# Patient Record
Sex: Male | Born: 2000 | Race: White | Hispanic: No | Marital: Single | Smoking: Never smoker
Health system: Southern US, Community
[De-identification: ages and names within clinical notes are randomized; demographics above are authoritative.]

---
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
## 2024-04-26 ENCOUNTER — Emergency Department (HOSPITAL_COMMUNITY)
Admission: EM | Admit: 2024-04-26 | Discharge: 2024-04-26 | Disposition: A | Discharge status: Home / Self Care | Attending: Emergency Medicine | Admitting: Emergency Medicine

## 2024-04-26 ENCOUNTER — Emergency Department (HOSPITAL_COMMUNITY)

## 2024-04-26 DIAGNOSIS — R079 Chest pain, unspecified: Secondary | ICD-10-CM | POA: Diagnosis not present

## 2024-04-26 DIAGNOSIS — D72829 Elevated white blood cell count, unspecified: Secondary | ICD-10-CM | POA: Diagnosis not present

## 2024-04-26 DIAGNOSIS — M79675 Pain in left toe(s): Secondary | ICD-10-CM | POA: Insufficient documentation

## 2024-04-26 DIAGNOSIS — S3991XA Unspecified injury of abdomen, initial encounter: Secondary | ICD-10-CM | POA: Diagnosis not present

## 2024-04-26 DIAGNOSIS — Y92410 Unspecified street and highway as the place of occurrence of the external cause: Secondary | ICD-10-CM | POA: Diagnosis not present

## 2024-04-26 DIAGNOSIS — S0990XA Unspecified injury of head, initial encounter: Secondary | ICD-10-CM | POA: Diagnosis present

## 2024-04-26 LAB — COMPREHENSIVE METABOLIC PANEL WITH GFR
ALT: 16 U/L (ref 0–44)
AST: 25 U/L (ref 15–41)
Albumin: 4.7 g/dL (ref 3.5–5.0)
Alkaline Phosphatase: 76 U/L (ref 38–126)
Anion gap: 11 (ref 5–15)
BUN: 9 mg/dL (ref 6–20)
CO2: 23 mmol/L (ref 22–32)
Calcium: 9.4 mg/dL (ref 8.9–10.3)
Chloride: 102 mmol/L (ref 98–111)
Creatinine, Ser: 0.83 mg/dL (ref 0.61–1.24)
GFR, Estimated: >60 mL/min (ref >60)
Glucose, Bld: 85 mg/dL (ref 70–99)
Potassium: 3.6 mmol/L (ref 3.5–5.1)
Sodium: 136 mmol/L (ref 135–145)
Total Bilirubin: 0.5 mg/dL (ref 0.0–1.2)
Total Protein: 7.1 g/dL (ref 6.5–8.1)

## 2024-04-26 LAB — URINALYSIS, ROUTINE W REFLEX MICROSCOPIC
Bacteria, UA: NONE SEEN
Bilirubin Urine: NEGATIVE
Glucose, UA: NEGATIVE mg/dL
Ketones, ur: NEGATIVE mg/dL
Nitrite: NEGATIVE
Protein, ur: NEGATIVE mg/dL
Specific Gravity, Urine: 1.016 (ref 1.005–1.030)
pH: 7 (ref 5.0–8.0)

## 2024-04-26 LAB — CBC
HCT: 40.7 % (ref 39.0–52.0)
Hemoglobin: 14.1 g/dL (ref 13.0–17.0)
MCH: 30.5 pg (ref 26.0–34.0)
MCHC: 34.6 g/dL (ref 30.0–36.0)
MCV: 88.1 fL (ref 80.0–100.0)
Platelets: 272 K/uL (ref 150–400)
RBC: 4.62 MIL/uL (ref 4.22–5.81)
RDW: 12.6 % (ref 11.5–15.5)
WBC: 15.3 K/uL — ABNORMAL HIGH (ref 4.0–10.5)
nRBC: 0 % (ref 0.0–0.2)

## 2024-04-26 MED ORDER — METHOCARBAMOL 500 MG PO TABS: 1000.0000 mg | ORAL_TABLET | Freq: Three times a day (TID) | ORAL | 0 refills | Status: AC | PRN

## 2024-04-26 MED ORDER — METHOCARBAMOL 500 MG PO TABS
1000.0000 mg | ORAL_TABLET | Freq: Once | ORAL | Status: AC
  Administered 2024-04-26: 1000 mg via ORAL
  Filled 2024-04-26: qty 2

## 2024-04-26 MED ORDER — ACETAMINOPHEN 325 MG PO TABS
650.0000 mg | ORAL_TABLET | Freq: Once | ORAL | Status: AC
  Administered 2024-04-26: 650 mg via ORAL
  Filled 2024-04-26: qty 2

## 2024-04-26 MED ORDER — IOHEXOL 300 MG/ML  SOLN
100.0000 mL | Freq: Once | INTRAMUSCULAR | Status: AC | PRN
Start: 2024-04-26 — End: 2024-04-26
  Administered 2024-04-26: 100 mL via INTRAVENOUS

## 2024-04-26 NOTE — ED Notes (Signed)
 ED Provider at bedside.

## 2024-04-26 NOTE — Discharge Instructions (Signed)
 Your test results today are reassuring.  You will have ongoing pain and soreness, which will likely be worse tomorrow.  Take ibuprofen  and Tylenol  as needed.  A prescription for a muscle relaxer was sent to your pharmacy.  Take this as needed as well.  Return to the emergency department for any new or worsening symptoms of concern.

## 2024-04-26 NOTE — ED Notes (Signed)
 Patient transported to CT
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
## 2024-04-27 LAB — I-STAT CHEM 8, ED
BUN: 9 mg/dL (ref 6–20)
Calcium, Ion: 1.19 mmol/L (ref 1.15–1.40)
Chloride: 103 mmol/L (ref 98–111)
Creatinine, Ser: 0.9 mg/dL (ref 0.61–1.24)
Glucose, Bld: 76 mg/dL (ref 70–99)
HCT: 45 % (ref 39.0–52.0)
Hemoglobin: 15.3 g/dL (ref 13.0–17.0)
Potassium: 3.4 mmol/L — ABNORMAL LOW (ref 3.5–5.1)
Sodium: 139 mmol/L (ref 135–145)
TCO2: 23 mmol/L (ref 22–32)
